# Patient Record
Sex: Male | Born: 1955
Health system: Southern US, Community
[De-identification: ages and names within clinical notes are randomized; demographics above are authoritative.]

## PROBLEM LIST (undated history)

## (undated) DIAGNOSIS — G2581 Restless legs syndrome: Secondary | ICD-10-CM

## (undated) DIAGNOSIS — I10 Essential (primary) hypertension: Secondary | ICD-10-CM

## (undated) DIAGNOSIS — G4730 Sleep apnea, unspecified: Secondary | ICD-10-CM

## (undated) DIAGNOSIS — F419 Anxiety disorder, unspecified: Secondary | ICD-10-CM

## (undated) DIAGNOSIS — E7800 Pure hypercholesterolemia, unspecified: Secondary | ICD-10-CM

## (undated) HISTORY — PX: APPENDECTOMY: SHX54

## (undated) HISTORY — PX: HERNIA REPAIR: SHX51

## (undated) HISTORY — PX: KNEE SURGERY: SHX244

## (undated) HISTORY — PX: CHOLECYSTECTOMY: SHX55

---

## 2010-05-28 ENCOUNTER — Ambulatory Visit: Payer: Self-pay | Admitting: Diagnostic Radiology

## 2010-05-28 ENCOUNTER — Emergency Department (HOSPITAL_BASED_OUTPATIENT_CLINIC_OR_DEPARTMENT_OTHER): Admission: EM | Admit: 2010-05-28 | Discharge: 2010-05-28 | Payer: Self-pay | Admitting: Emergency Medicine

## 2016-11-19 ENCOUNTER — Emergency Department (HOSPITAL_BASED_OUTPATIENT_CLINIC_OR_DEPARTMENT_OTHER)
Admission: EM | Admit: 2016-11-19 | Discharge: 2016-11-19 | Disposition: A | Discharge status: Home / Self Care | Payer: BLUE CROSS/BLUE SHIELD | Attending: Emergency Medicine | Admitting: Emergency Medicine

## 2016-11-19 ENCOUNTER — Encounter (HOSPITAL_BASED_OUTPATIENT_CLINIC_OR_DEPARTMENT_OTHER): Payer: Self-pay | Admitting: *Deleted

## 2016-11-19 DIAGNOSIS — R69 Illness, unspecified: Secondary | ICD-10-CM

## 2016-11-19 DIAGNOSIS — R0981 Nasal congestion: Secondary | ICD-10-CM | POA: Insufficient documentation

## 2016-11-19 DIAGNOSIS — M791 Myalgia: Secondary | ICD-10-CM | POA: Insufficient documentation

## 2016-11-19 DIAGNOSIS — R509 Fever, unspecified: Secondary | ICD-10-CM | POA: Diagnosis not present

## 2016-11-19 DIAGNOSIS — R05 Cough: Secondary | ICD-10-CM | POA: Insufficient documentation

## 2016-11-19 DIAGNOSIS — J111 Influenza due to unidentified influenza virus with other respiratory manifestations: Secondary | ICD-10-CM

## 2016-11-19 DIAGNOSIS — Z87891 Personal history of nicotine dependence: Secondary | ICD-10-CM | POA: Insufficient documentation

## 2016-11-19 DIAGNOSIS — Z79899 Other long term (current) drug therapy: Secondary | ICD-10-CM | POA: Insufficient documentation

## 2016-11-19 HISTORY — DX: Anxiety disorder, unspecified: F41.9

## 2016-11-19 HISTORY — DX: Sleep apnea, unspecified: G47.30

## 2016-11-19 HISTORY — DX: Restless legs syndrome: G25.81

## 2016-11-19 HISTORY — DX: Pure hypercholesterolemia, unspecified: E78.00

## 2016-11-19 MED ORDER — BENZONATATE 100 MG PO CAPS: 100.0000 mg | ORAL_CAPSULE | Freq: Three times a day (TID) | ORAL | 0 refills | Status: AC

## 2016-11-19 MED FILL — BENZONATATE 100 MG CAP: 100 | 7 days supply | Qty: 21 | Fill #0

## 2016-11-19 NOTE — ED Notes (Signed)
Pt directed to pharmacy. Ambulatory with steady gait

## 2016-11-19 NOTE — ED Provider Notes (Signed)
[facility]   CSN: [id number] Arrival date & time: 11/19/16  1135     History   Chief Complaint Chief Complaint  Patient presents with  . Cough    HPI Clayton [name] is a 61 y.o. male.  61 yo M with a chief complaints of cough congestion fevers chills myalgias. Going on for the past couple days. Patient is in setting on a time at a nursing home as is mother recently passed away. He says he had a lot of sick contacts. His wife has a similar illness.   The history is provided by the patient.  Cough  This is a new problem. The current episode started yesterday. The problem occurs constantly. The cough is non-productive. The maximum temperature recorded prior to his arrival was 100 to 100.9 F. The fever has been present for less than 1 day. Associated symptoms include chills and myalgias. Pertinent negatives include no chest pain, no headaches and no shortness of breath. He has tried nothing for the symptoms. The treatment provided no relief.    Past Medical History:  Diagnosis Date  . Anxiety   . High cholesterol   . RLS (restless legs syndrome)   . Sleep apnea     There are no active problems to display for this patient.   Past Surgical History:  Procedure Laterality Date  . APPENDECTOMY    . CHOLECYSTECTOMY    . HERNIA REPAIR    . KNEE SURGERY         Home Medications    Prior to Admission medications   Medication Sig Start Date End Date Taking? Authorizing Provider  DULoxetine HCl (CYMBALTA PO) Take by mouth.   Yes Historical Provider, MD  Pramipexole Dihydrochloride (MIRAPEX PO) Take by mouth.   Yes Historical Provider, MD  SIMVASTATIN PO Take by mouth.   Yes Historical Provider, MD  benzonatate (TESSALON) 100 MG capsule Take 1 capsule (100 mg total) by mouth every 8 (eight) hours. 11/19/16   Melene Plan, DO    Family History No family history on file.  Social History Social History  Substance Use Topics  . Smoking status: Former  Games developer  . Smokeless tobacco: Never Used  . Alcohol use Yes     Allergies   Patient has no known allergies.   Review of Systems Review of Systems  Constitutional: Positive for chills and fever.  HENT: Positive for congestion. Negative for facial swelling.   Eyes: Negative for discharge and visual disturbance.  Respiratory: Positive for cough. Negative for shortness of breath.   Cardiovascular: Negative for chest pain and palpitations.  Gastrointestinal: Negative for abdominal pain, diarrhea and vomiting.  Musculoskeletal: Positive for myalgias. Negative for arthralgias.  Skin: Negative for color change and rash.  Neurological: Negative for tremors, syncope and headaches.  Psychiatric/Behavioral: Negative for confusion and dysphoric mood.     Physical Exam Updated Vital Signs BP 127/79 (BP Location: Right Arm)   Pulse 105   Temp 99.2 F (37.3 C)   Resp 24   Ht 5\' 8"  (1.727 m)   Wt 217 lb 6.4 oz (98.6 kg)   SpO2 95%   BMI 33.06 kg/m   Physical Exam  Constitutional: He is oriented to person, place, and time. He appears well-developed and well-nourished.  HENT:  Head: Normocephalic and atraumatic.  Swollen turbinates, posterior nasal drip, no noted sinus ttp, tm normal bilaterally.    Eyes: EOM are normal. Pupils are equal, round, and reactive to light.  Neck: Normal  range of motion. Neck supple. No JVD present.  Cardiovascular: Normal rate and regular rhythm.  Exam reveals no gallop and no friction rub.   No murmur heard. Pulmonary/Chest: No respiratory distress. He has no wheezes.  Abdominal: He exhibits no distension. There is no rebound and no guarding.  Musculoskeletal: Normal range of motion.  Neurological: He is alert and oriented to person, place, and time.  Skin: No rash noted. No pallor.  Psychiatric: He has a normal mood and affect. His behavior is normal.  Nursing note and vitals reviewed.    ED Treatments / Results  Labs (all labs ordered are  listed, but only abnormal results are displayed) Labs Reviewed - No data to display  EKG  EKG Interpretation None       Radiology No results found.  Procedures Procedures (including critical care time)  Medications Ordered in ED Medications - No data to display   Initial Impression / Assessment and Plan / ED Course  I have reviewed the triage vital signs and the nursing notes.  Pertinent labs & imaging results that were available during my care of the patient were reviewed by me and considered in my medical decision making (see chart for details).     61 yo M With a chief complaint of influenza-like illness. Discussed risks and benefits of Tamiflu patient declining at this time. No bacterial source found on exam. Discharge home for her symptomatically therapy.  1:53 PM:  I have discussed the diagnosis/risks/treatment options with the patient and believe the pt to be eligible for discharge home to follow-up with PCP. We also discussed returning to the ED immediately if new or worsening sx occur. We discussed the sx which are most concerning (e.g., sudden worsening pain, fever, inability to tolerate by mouth) that necessitate immediate return. Medications administered to the patient during their visit and any new prescriptions provided to the patient are listed below.  Medications given during this visit Medications - No data to display   The patient appears reasonably screen and/or stabilized for discharge and I doubt any other medical condition or other [facility] requiring further screening, evaluation, or treatment in the ED at this time prior to discharge.    Final Clinical Impressions(s) / ED Diagnoses   Final diagnoses:  Influenza-like illness    New Prescriptions New Prescriptions   BENZONATATE (TESSALON) 100 MG CAPSULE    Take 1 capsule (100 mg total) by mouth every 8 (eight) hours.     Melene Planan Raul Torrance, DO 11/19/16 1353

## 2016-11-19 NOTE — ED Triage Notes (Signed)
Cough x 2 days. Diarrhea, body aches. He took Tylenol an hour ago.

## 2016-11-19 NOTE — Discharge Instructions (Signed)
Take tylenol 2 pills 4 times a day and motrin 4 pills 3 times a day.  Drink plenty of fluids.  Return for worsening shortness of breath, headache, confusion. Follow up with your family doctor.   

## 2021-09-18 ENCOUNTER — Emergency Department (HOSPITAL_BASED_OUTPATIENT_CLINIC_OR_DEPARTMENT_OTHER): Payer: No Typology Code available for payment source

## 2021-09-18 ENCOUNTER — Other Ambulatory Visit: Payer: Self-pay

## 2021-09-18 ENCOUNTER — Emergency Department (HOSPITAL_BASED_OUTPATIENT_CLINIC_OR_DEPARTMENT_OTHER)
Admission: EM | Admit: 2021-09-18 | Discharge: 2021-09-18 | Disposition: A | Discharge status: Home / Self Care | Payer: No Typology Code available for payment source | Attending: Emergency Medicine | Admitting: Emergency Medicine

## 2021-09-18 ENCOUNTER — Encounter (HOSPITAL_BASED_OUTPATIENT_CLINIC_OR_DEPARTMENT_OTHER): Payer: Self-pay | Admitting: *Deleted

## 2021-09-18 DIAGNOSIS — R0789 Other chest pain: Secondary | ICD-10-CM | POA: Insufficient documentation

## 2021-09-18 DIAGNOSIS — R0781 Pleurodynia: Secondary | ICD-10-CM

## 2021-09-18 DIAGNOSIS — Z87891 Personal history of nicotine dependence: Secondary | ICD-10-CM | POA: Insufficient documentation

## 2021-09-18 DIAGNOSIS — I10 Essential (primary) hypertension: Secondary | ICD-10-CM | POA: Insufficient documentation

## 2021-09-18 DIAGNOSIS — Z79899 Other long term (current) drug therapy: Secondary | ICD-10-CM | POA: Diagnosis not present

## 2021-09-18 DIAGNOSIS — Y92410 Unspecified street and highway as the place of occurrence of the external cause: Secondary | ICD-10-CM | POA: Diagnosis not present

## 2021-09-18 DIAGNOSIS — S0181XA Laceration without foreign body of other part of head, initial encounter: Secondary | ICD-10-CM | POA: Insufficient documentation

## 2021-09-18 HISTORY — DX: Essential (primary) hypertension: I10
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
## 2021-09-18 NOTE — Discharge Instructions (Signed)
Use the and spent incentive spirometer at least 3 times daily.  You can take Motrin or Tylenol for the pain.  Return to the ED if you start having abdominal pain or noticing blood in your stool.  Follow-up with your primary care doctor about any lingering musculoskeletal pain.
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
## 2024-01-05 ENCOUNTER — Emergency Department (HOSPITAL_BASED_OUTPATIENT_CLINIC_OR_DEPARTMENT_OTHER)

## 2024-01-05 ENCOUNTER — Encounter (HOSPITAL_BASED_OUTPATIENT_CLINIC_OR_DEPARTMENT_OTHER): Payer: Self-pay | Admitting: Emergency Medicine

## 2024-01-05 ENCOUNTER — Other Ambulatory Visit: Payer: Self-pay

## 2024-01-05 ENCOUNTER — Emergency Department (HOSPITAL_BASED_OUTPATIENT_CLINIC_OR_DEPARTMENT_OTHER)
Admission: EM | Admit: 2024-01-05 | Discharge: 2024-01-05 | Disposition: A | Discharge status: Home / Self Care | Attending: Emergency Medicine | Admitting: Emergency Medicine

## 2024-01-05 DIAGNOSIS — Z23 Encounter for immunization: Secondary | ICD-10-CM | POA: Insufficient documentation

## 2024-01-05 DIAGNOSIS — W268XXA Contact with other sharp object(s), not elsewhere classified, initial encounter: Secondary | ICD-10-CM | POA: Diagnosis not present

## 2024-01-05 DIAGNOSIS — S61211A Laceration without foreign body of left index finger without damage to nail, initial encounter: Secondary | ICD-10-CM

## 2024-01-05 DIAGNOSIS — S61321A Laceration with foreign body of left index finger with damage to nail, initial encounter: Secondary | ICD-10-CM | POA: Insufficient documentation

## 2024-01-05 DIAGNOSIS — S60941A Unspecified superficial injury of left index finger, initial encounter: Secondary | ICD-10-CM | POA: Diagnosis present

## 2024-01-05 MED ORDER — AMOXICILLIN-POT CLAVULANATE 875-125 MG PO TABS
1.0000 | ORAL_TABLET | Freq: Once | ORAL | Status: AC
  Administered 2024-01-05: 1 via ORAL
  Filled 2024-01-05: qty 1

## 2024-01-05 MED ORDER — BACITRACIN ZINC 500 UNIT/GM EX OINT
TOPICAL_OINTMENT | Freq: Two times a day (BID) | CUTANEOUS | Status: DC
  Filled 2024-01-05: qty 28.35

## 2024-01-05 MED ORDER — TETANUS-DIPHTH-ACELL PERTUSSIS 5-2.5-18.5 LF-MCG/0.5 IM SUSY
0.5000 mL | PREFILLED_SYRINGE | Freq: Once | INTRAMUSCULAR | Status: AC
  Administered 2024-01-05: 0.5 mL via INTRAMUSCULAR
  Filled 2024-01-05: qty 0.5

## 2024-01-05 MED ORDER — AMOXICILLIN-POT CLAVULANATE 875-125 MG PO TABS
1.0000 | ORAL_TABLET | Freq: Two times a day (BID) | ORAL | 0 refills | Status: AC
Start: 2024-01-05 — End: ?

## 2024-01-05 NOTE — ED Triage Notes (Signed)
 Pt with laceration/puncture wound to LT hand from a drill today
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
## 2024-01-05 NOTE — Discharge Instructions (Signed)
 Wound cleaned in the ED today.  Your tetanus was updated today.  We have started on antibiotics. Xray any broken bones, retained foreign objects.  Make sure to follow-up outpatient, return for worsening symptoms
